# Patient Record
Sex: Female | Born: 2000 | Race: White | Hispanic: No | Marital: Single | Smoking: Never smoker
Health system: Southern US, Community
[De-identification: ages and names within clinical notes are randomized; demographics above are authoritative.]

## PROBLEM LIST (undated history)

## (undated) DIAGNOSIS — J45909 Unspecified asthma, uncomplicated: Secondary | ICD-10-CM

## (undated) DIAGNOSIS — F419 Anxiety disorder, unspecified: Secondary | ICD-10-CM

## (undated) DIAGNOSIS — F32A Depression, unspecified: Secondary | ICD-10-CM

## (undated) HISTORY — DX: Unspecified asthma, uncomplicated: J45.909

## (undated) HISTORY — PX: OTHER SURGICAL HISTORY: SHX169

## (undated) HISTORY — DX: Anxiety disorder, unspecified: F41.9

## (undated) HISTORY — DX: Depression, unspecified: F32.A

---
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
## 2023-01-19 ENCOUNTER — Encounter: Payer: Self-pay | Admitting: *Deleted

## 2023-06-13 ENCOUNTER — Encounter: Payer: Self-pay | Admitting: Nurse Practitioner

## 2023-06-26 DIAGNOSIS — F4323 Adjustment disorder with mixed anxiety and depressed mood: Secondary | ICD-10-CM | POA: Diagnosis not present

## 2023-07-05 DIAGNOSIS — F418 Other specified anxiety disorders: Secondary | ICD-10-CM | POA: Diagnosis not present

## 2023-07-11 DIAGNOSIS — F4323 Adjustment disorder with mixed anxiety and depressed mood: Secondary | ICD-10-CM | POA: Diagnosis not present

## 2023-07-19 DIAGNOSIS — F418 Other specified anxiety disorders: Secondary | ICD-10-CM | POA: Diagnosis not present

## 2023-07-25 DIAGNOSIS — Z5181 Encounter for therapeutic drug level monitoring: Secondary | ICD-10-CM | POA: Diagnosis not present

## 2023-08-15 ENCOUNTER — Ambulatory Visit: Payer: 59 | Admitting: Nurse Practitioner

## 2023-08-15 ENCOUNTER — Encounter: Payer: Self-pay | Admitting: Nurse Practitioner

## 2023-08-15 ENCOUNTER — Other Ambulatory Visit (INDEPENDENT_AMBULATORY_CARE_PROVIDER_SITE_OTHER): Payer: 59

## 2023-08-15 VITALS — BP 100/62 | HR 71 | Ht 62.0 in | Wt 112.0 lb

## 2023-08-15 DIAGNOSIS — R1084 Generalized abdominal pain: Secondary | ICD-10-CM

## 2023-08-15 DIAGNOSIS — R112 Nausea with vomiting, unspecified: Secondary | ICD-10-CM | POA: Diagnosis not present

## 2023-08-15 DIAGNOSIS — K5900 Constipation, unspecified: Secondary | ICD-10-CM

## 2023-08-15 LAB — CBC WITH DIFFERENTIAL/PLATELET
Basophils Absolute: 0 10*3/uL (ref 0.0–0.1)
Basophils Relative: 1 % (ref 0.0–3.0)
Eosinophils Absolute: 0.1 10*3/uL (ref 0.0–0.7)
Eosinophils Relative: 2.6 % (ref 0.0–5.0)
HCT: 42.1 % (ref 36.0–49.0)
Hemoglobin: 14 g/dL (ref 12.0–16.0)
Lymphocytes Relative: 33.2 % (ref 24.0–48.0)
Lymphs Abs: 1.5 10*3/uL (ref 0.7–4.0)
MCHC: 33.2 g/dL (ref 31.0–37.0)
MCV: 90.7 fL (ref 78.0–98.0)
Monocytes Absolute: 0.5 10*3/uL (ref 0.1–1.0)
Monocytes Relative: 10.9 % (ref 3.0–12.0)
Neutro Abs: 2.4 10*3/uL (ref 1.4–7.7)
Neutrophils Relative %: 52.3 % (ref 43.0–71.0)
Platelets: 283 10*3/uL (ref 150.0–575.0)
RBC: 4.64 Mil/uL (ref 3.80–5.70)
RDW: 12.8 % (ref 11.4–15.5)
WBC: 4.6 10*3/uL (ref 4.5–13.5)

## 2023-08-15 LAB — TSH: TSH: 0.84 u[IU]/mL (ref 0.40–5.00)

## 2023-08-15 NOTE — Progress Notes (Signed)
08/15/2023 Kristi Diaz [id number] [birth year]   CHIEF COMPLAINT: Abdominal pain  HISTORY OF PRESENT ILLNESS: Kristi Diaz is a 22 year old female with a past medical history of anxiety, depression, PTSD and asthma. She presents to our office today as referred by Dr. Neoma Laming for further evaluation regarding N/V and upper abdominal pain. She is accompanied by her mother. She has a decreased appetite for the past 4 to 5 years which she attributed to side effects from anxiety and antidepressant medications. She stated always being a picky eater. She describes having an eating disorder, sometimes doesn't eat and sometimes induces vomiting. She also has intermittent nausea and vomits spontaneously. Last vomited one month ago. She often does not have the urge to eat, when she gets the urge to eat, she eats quickly before she thinks too much about it. Certain smells of food makes her feel sick. She feels overwhelmed and sick to her stomach when she goes to the grocery store. She had daily heartburn with intermittent dysphagia, globus sensation for the entire month of August which mostly abated. Since then, she has heartburn only if she eats tomato products or foods with a lot of seasonings. She had severe pain throughout her right upper and lower abdomen extending like a bean shape formation to this area on 06/03/2023 therefore she presented to [facility] ED for further evaluation.  She was concerned she might have gallbladder issues. At that time, she had nausea and vomiting followed by upper abdominal wall spasms.  She described emesis as clear to bilious and a few times she saw a few spots of blood in the emesis.  CTAP was negative for acute intra-abdominal/pelvic pathology.  WBC 15.6.  Hemoglobin 14.1.  Platelet 417.  Sodium 138.  Potassium 3.8.  Glucose 129.  BUN 13.  Creatinine 0.72.  Total bili 0.8.  Alk phos 42.  AST 12.  ALT 4.  Lipase 14.  Urine hCG negative.  Urinalysis showed  positive for leukocytes and she was treated with Keflex p.o. for suspected UTI and was discharged home.  She stated her baseline weight was 125 pounds, mom stated she did not think daughter's normal weight was typically that high.  Weight in office today 112 pounds.  He eats a brownie or drinks a protein shake in the morning.  She typically eats chicken nuggets for lunch at [employer] and [employer] or salad for dinner.  She eats strawberries, no other fruits.  States she is to have a bowel movement time she ate but for the past month she has experienced constipation.  She is passing pellet-like stool most days.  She started taking MiraLAX 1 week ago.  No bloody or black stools.  Overall, over the past month she stated her GI symptoms have significantly improved.  She is seeing a therapist weekly and is scheduled to see a new psychiatrist in the upcoming weeks. Remains on [facility].  CTAP with contrast 06/03/2023 [facility] ED.  FINDINGS:  Lower chest: Unremarkable.   Hepatobiliary: No suspicious focal abnormality within the liver  parenchyma. There is no evidence for gallstones, gallbladder wall  thickening, or pericholecystic fluid. No intrahepatic or  extrahepatic biliary dilation.   Pancreas: No focal mass lesion. No dilatation of the main duct. No  intraparenchymal cyst. No peripancreatic edema.   Spleen: No splenomegaly. No suspicious focal mass lesion.   Adrenals/Urinary Tract: No adrenal nodule or mass. Kidneys  unremarkable. No evidence for hydroureter. The urinary bladder  appears  normal for the degree of distention.   Stomach/Bowel: Stomach is unremarkable. No gastric wall thickening.  No evidence of outlet obstruction. Duodenum is normally positioned  as is the ligament of Treitz. No small bowel wall thickening. No  small bowel dilatation. The terminal ileum is normal. The appendix  is normal. No gross colonic mass. No colonic wall thickening.   Vascular/Lymphatic: No abdominal  aortic aneurysm. No abdominal  aortic atherosclerotic calcification. There is no gastrohepatic or  hepatoduodenal ligament lymphadenopathy. No retroperitoneal or  mesenteric lymphadenopathy. No pelvic sidewall lymphadenopathy.   Reproductive: Unremarkable.   Other: Trace free fluid seen in the cul-de-sac.   Musculoskeletal: No worrisome lytic or sclerotic osseous  abnormality.   Social History: She occasionally drinks alcohol, Friday drank, prior to Friday didn't have any alcohol for a long time. Friday 3 shots rumchada.  She smokes marijuana daily x 2 years   Family History: No known family history of celiac disease or IBD.  No known family history of esophageal, gastric or colon cancer.  No Known Allergies   Outpatient Encounter Medications as of 08/15/2023  Medication Sig   cetirizine (ZYRTEC) 10 MG tablet Take 10 mg by mouth daily.   Pediatric Multiple Vit-C-FA (CHILDRENS CHEWABLE VITAMINS PO) Take 1 tablet by mouth daily.   No facility-administered encounter medications on file as of 08/15/2023.   REVIEW OF SYSTEMS:  Gen: + Fatigue. See HPI.  CV: Denies chest pain, palpitations or edema. Resp: Denies cough, shortness of breath of hemoptysis.  GI: See HPI.  GU: Denies urinary burning, blood in urine, increased urinary frequency or incontinence. Gyn: + Menstrual pain.  MS: + Back pain. Derm: Denies rash, itchiness, skin lesions or unhealing ulcers. Psych:+ Anxiety, depression. Heme: Denies bruising, easy bleeding. Neuro: + Headaches.  Endo:  Denies any problems with DM, thyroid or adrenal function.  PHYSICAL EXAM: BP 100/62   Pulse 71   Ht 5\' 2"  (1.575 m)   Wt 112 lb (50.8 kg)   BMI 20.49 kg/m   General: 22 year old female in no acute distress. Head: Normocephalic and atraumatic. Eyes:  Sclerae non-icteric, conjunctive pink. Ears: Normal auditory acuity. Mouth: Dentition intact. No ulcers or lesions.  Neck: Supple, no lymphadenopathy or thyromegaly.  Lungs:  Clear bilaterally to auscultation without wheezes, crackles or rhonchi. Heart: Regular rate and rhythm. No murmur, rub or gallop appreciated.  Abdomen: Soft, nondistended.  Mild generalized tenderness without rebound or guarding.  No masses. No hepatosplenomegaly. Normoactive bowel sounds x 4 quadrants.  Umbilical  ring intact. Rectal: Deferred.  Musculoskeletal: Symmetrical with no gross deformities. Skin: Warm and dry. No rash or lesions on visible extremities. Extremities: No edema. Neurological: Alert oriented x 4, no focal deficits.  Psychological:  Alert and cooperative. Normal mood and affect.  ASSESSMENT AND PLAN:  22 year old female with history of an eating disorder, decreased oral intake + intermittent self induced vomiting with recurrent nausea with episodes of spontaneous vomiting, heartburn, dysphagia, globus sensation and right sided abdominal pain. All of her GI symptoms have significantly improved over the past month. Normal CTAP. Low/normal LFTs and lipase level.  -Famotidine 20 mg p.o. daily as needed -Patient instructed to stop marijuana use as marijuana use likely contributing to N/V -Check Diatherix H. Pylori stool antigen -Recommended eating 3-4 small snack size meals daily -I discussed scheduling RUQ sonogram to evaluate the gallbladder if N/V and RUQ pain persists/recurs.  -I discussed scheduling an EGD if symptoms persist  -Follow up in 6 weeks  -Patient prefers female  GI, Dr. Leonides Schanz agreed to be patient's GI   Constipation -tTG, IgA and TSH -MiraLAX nightly as tolerated  Anxiety, depression and PTSD -Continue follow up with therapist and proceed with consult with new psychiatrist     CC:  Ronney Asters, MD

## 2023-08-15 NOTE — Patient Instructions (Addendum)
Your provider has requested that you go to the basement level for lab work before leaving today. Press "B" on the elevator. The lab is located at the first door on the left as you exit the elevator.   Your provider has ordered "Diatherix" stool testing for you. You have received a kit from our office today containing all necessary supplies to complete this test. Please carefully read the stool collection instructions provided in the kit before opening the accompanying materials. In addition, be sure to place the label from the top right corner of the laboratory request sheet onto the "puritan opti-swab" tube that is supplied in the kit. This label should include your full name and date of birth. After completing the test, you should secure the purtian tube into the specimen biohazard bag. The laboratory request information sheet (including date and time of specimen collection) should be placed into the outside pocket of the specimen biohazard bag and returned to the [facility] lab with 2 days of collection.   If the laboratory information sheet specimen date and time are not filled out, the test will NOT be performed.  Pepcid 20 mg (over the counter)- take 1 by mouth daily as needed  Miralax- every night as tolerated  Benefiber-take 1 tablespoon daily  Recommend eating 3-4 small snack sized meals daily.  Stop marijuana use.  Due to recent changes in healthcare laws, you may see the results of your imaging and laboratory studies on MyChart before your provider has had a chance to review them.  We understand that in some cases there may be results that are confusing or concerning to you. Not all laboratory results come back in the same time frame and the provider may be waiting for multiple results in order to interpret others.  Please give [place] 48 hours in order for your provider to thoroughly review all the results before contacting the office for clarification of your results.   Thank you for trusting  me with your gastrointestinal care!   Alcide Evener, CRNP

## 2023-08-15 NOTE — Progress Notes (Signed)
I agree with the assessment and plan as outlined by Ms. Kennedy-Smith. 

## 2023-08-17 LAB — IGA: Immunoglobulin A: 75 mg/dL (ref 47–310)

## 2023-08-17 LAB — TISSUE TRANSGLUTAMINASE ABS,IGG,IGA
(tTG) Ab, IgA: <1 U/mL
(tTG) Ab, IgG: <1 U/mL

## 2023-11-08 NOTE — Progress Notes (Deleted)
 11/08/2023 Kristi Diaz [id number] 02/28/2001   Chief Complaint:  History of Present Illness:  Kristi Diaz is a 23 year old female with a past medical history of anxiety, depression, PTSD and asthma.  I initially saw Kristi Diaz in office on 08/15/2023 due to nausea, vomiting and upper abdominal pain. Diatherix H. Pylori stool antigen test was ordered but was not done. TSH 0.84. tTG IgA < 1, tTG IgG < 1 and IgA 75.      Latest Ref Rng & Units 08/15/2023    9:49 AM  CBC  WBC 4.5 - 13.5 K/uL 4.6   Hemoglobin 12.0 - 16.0 g/dL 85.9   Hematocrit 63.9 - 49.0 % 42.1   Platelets 150.0 - 575.0 K/uL 283.0         CTAP with contrast 06/03/2023 [facility].  FINDINGS:  Lower chest: Unremarkable.   Hepatobiliary: No suspicious focal abnormality within the liver  parenchyma. There is no evidence for gallstones, gallbladder wall  thickening, or pericholecystic fluid. No intrahepatic or  extrahepatic biliary dilation.   Pancreas: No focal mass lesion. No dilatation of the main duct. No  intraparenchymal cyst. No peripancreatic edema.   Spleen: No splenomegaly. No suspicious focal mass lesion.   Adrenals/Urinary Tract: No adrenal nodule or mass. Kidneys  unremarkable. No evidence for hydroureter. The urinary bladder  appears normal for the degree of distention.   Stomach/Bowel: Stomach is unremarkable. No gastric wall thickening.  No evidence of outlet obstruction. Duodenum is normally positioned  as is the ligament of Treitz. No small bowel wall thickening. No  small bowel dilatation. The terminal ileum is normal. The appendix  is normal. No gross colonic mass. No colonic wall thickening.   Vascular/Lymphatic: No abdominal aortic aneurysm. No abdominal  aortic atherosclerotic calcification. There is no gastrohepatic or  hepatoduodenal ligament lymphadenopathy. No retroperitoneal or  mesenteric lymphadenopathy. No pelvic sidewall lymphadenopathy.   Reproductive:  Unremarkable.   Other: Trace free fluid seen in the cul-de-sac.   Musculoskeletal: No worrisome lytic or sclerotic osseous  abnormality.      Current Medications, Allergies, Past Medical History, Past Surgical History, Family History and Social History were reviewed in [employer] record.   Review of Systems:   Constitutional: Negative for fever, sweats, chills or weight loss.  Respiratory: Negative for shortness of breath.   Cardiovascular: Negative for chest pain, palpitations and leg swelling.  Gastrointestinal: See HPI.  Musculoskeletal: Negative for back pain or muscle aches.  Neurological: Negative for dizziness, headaches or paresthesias.    Physical Exam: There were no vitals taken for this visit. General: in no acute distress. Head: Normocephalic and atraumatic. Eyes: No scleral icterus. Conjunctiva pink . Ears: Normal auditory acuity. Mouth: Dentition intact. No ulcers or lesions.  Lungs: Clear throughout to auscultation. Heart: Regular rate and rhythm, no murmur. Abdomen: Soft, nontender and nondistended. No masses or hepatomegaly. Normal bowel sounds x 4 quadrants.  Rectal: Deferred.  Musculoskeletal: Symmetrical with no gross deformities. Extremities: No edema. Neurological: Alert oriented x 4. No focal deficits.  Psychological: Alert and cooperative. Normal mood and affect  Assessment and Recommendations:   23 year old female with history of an eating disorder, decreased oral intake + intermittent self induced vomiting with recurrent nausea with episodes of spontaneous vomiting, heartburn, dysphagia, globus sensation and right sided abdominal pain. All of her GI symptoms have significantly improved over the past month. Normal CTAP. Low/normal LFTs and lipase level.     Constipation -MiraLAX nightly  as tolerated   Anxiety, depression and PTSD

## 2023-11-09 ENCOUNTER — Ambulatory Visit: Payer: 59 | Admitting: Nurse Practitioner

## 2023-11-24 ENCOUNTER — Ambulatory Visit (HOSPITAL_COMMUNITY)
Admission: EM | Admit: 2023-11-24 | Discharge: 2023-11-24 | Disposition: A | Discharge status: Home / Self Care | Payer: 59 | Attending: Emergency Medicine | Admitting: Emergency Medicine

## 2023-11-24 ENCOUNTER — Encounter (HOSPITAL_COMMUNITY): Payer: Self-pay

## 2023-11-24 DIAGNOSIS — J4521 Mild intermittent asthma with (acute) exacerbation: Secondary | ICD-10-CM

## 2023-11-24 DIAGNOSIS — J101 Influenza due to other identified influenza virus with other respiratory manifestations: Secondary | ICD-10-CM

## 2023-11-24 LAB — POC COVID19/FLU A&B COMBO
Covid Antigen, POC: NEGATIVE
Influenza A Antigen, POC: POSITIVE — AB
Influenza B Antigen, POC: NEGATIVE

## 2023-11-24 MED ORDER — FLUTICASONE PROPIONATE 50 MCG/ACT NA SUSP: 1.0000 | Freq: Every day | NASAL | 2 refills | Status: DC

## 2023-11-24 MED ORDER — PROMETHAZINE-DM 6.25-15 MG/5ML PO SYRP: 5.0000 mL | ORAL_SOLUTION | Freq: Four times a day (QID) | ORAL | 0 refills | Status: DC | PRN

## 2023-11-24 MED ORDER — CETIRIZINE HCL 10 MG PO TABS: 10.0000 mg | ORAL_TABLET | Freq: Every day | ORAL | 1 refills | Status: AC

## 2023-11-24 MED ORDER — ALBUTEROL SULFATE HFA 108 (90 BASE) MCG/ACT IN AERS: 2.0000 | INHALATION_SPRAY | Freq: Four times a day (QID) | RESPIRATORY_TRACT | 2 refills | Status: AC | PRN

## 2023-11-24 NOTE — ED Triage Notes (Signed)
Pt states cough,runny nose and body aches for the past 4 days. States she has been taking tylenol cold at home with relief.

## 2023-11-24 NOTE — ED Provider Notes (Signed)
[facility]    CSN: [id number] Arrival date & time: 11/24/23  1618    HISTORY   Chief Complaint  Patient presents with   Cough   HPI MARLISE Diaz is a pleasant, 23 y.o. female who presents to urgent care today. Patient complains of cough, runny nose and bodyaches for the past 4 days.  Patient states she took Tylenol cold and relief earlier today which significantly improved her symptoms.  Prior to that, she had not been taking anything.  Patient reports a history of asthma and allergies, currently taking Zyrtec, albuterol inhaler is expired.  Patient states she went to a funeral with her mother last week, many people there were sick, mother has similar symptoms.  The history is provided by the patient.   Past Medical History:  Diagnosis Date   Anxiety    Asthma    Depression    Patient Active Problem List   Diagnosis Date Noted   Nausea with vomiting 08/15/2023   Hip abductor tendinitis, right 12/19/2019   Past Surgical History:  Procedure Laterality Date   wisdom teeth removal     OB History   No obstetric history on file.    Home Medications    Prior to Admission medications   Medication Sig Start Date End Date Taking? Authorizing Provider  albuterol (VENTOLIN HFA) 108 (90 Base) MCG/ACT inhaler Inhale 2 puffs into the lungs as needed. 03/20/23   [provider]  ALPRAZolam Prudy Feeler) 0.25 MG tablet Take 1 tablet by mouth 3 (three) times daily as needed.    [provider]  brexpiprazole (REXULTI) 1 MG TABS tablet Take 1 mg by mouth daily. 07/26/23   [provider]  INCASSIA 0.35 MG tablet Take 1 tablet by mouth daily.    [provider]  NURTEC 75 MG TBDP Take 1 tablet by mouth as needed.    [provider]  promethazine (PHENERGAN) 12.5 MG tablet Take 12.5 mg by mouth as needed.    [provider]    Family History History reviewed. No pertinent family history. Social History Social History    Tobacco Use   Smoking status: Never   Smokeless tobacco: Never  Vaping Use   Vaping status: Never Used  Substance Use Topics   Alcohol use: Never   Drug use: Never   Allergies   Patient has no known allergies.  Review of Systems Review of Systems Pertinent findings revealed after performing a 14 point review of systems has been noted in the history of present illness.  Physical Exam Vital Signs BP 128/75 (BP Location: Left Arm)   Pulse 91   Temp 98.8 F (37.1 C) (Oral)   Resp 16   LMP 09/29/2023 (Approximate)   SpO2 98%   No data found.  Physical Exam Constitutional:      Appearance: She is ill-appearing.  HENT:     Head: Normocephalic and atraumatic.     Salivary Glands: Right salivary gland is not diffusely enlarged or tender. Left salivary gland is not diffusely enlarged or tender.     Right Ear: Tympanic membrane, ear canal and external ear normal.     Left Ear: Tympanic membrane, ear canal and external ear normal.     Nose: Congestion and rhinorrhea present. Rhinorrhea is clear.     Right Sinus: No maxillary sinus tenderness or frontal sinus tenderness.     Left Sinus: No maxillary sinus tenderness.     Mouth/Throat:     Mouth: Mucous membranes  are moist.     Pharynx: Pharyngeal swelling, posterior oropharyngeal erythema and uvula swelling present.     Tonsils: No tonsillar exudate. 0 on the right. 0 on the left.  Cardiovascular:     Rate and Rhythm: Normal rate and regular rhythm.     Pulses: Normal pulses.  Pulmonary:     Effort: Pulmonary effort is normal. No tachypnea, bradypnea, accessory muscle usage, prolonged expiration or respiratory distress.     Breath sounds: No stridor, decreased air movement or transmitted upper airway sounds. No decreased breath sounds, wheezing, rhonchi or rales.  Abdominal:     General: Abdomen is flat. Bowel sounds are normal.     Palpations: Abdomen is soft.  Musculoskeletal:        General: Normal range of motion.   Lymphadenopathy:     Cervical: Cervical adenopathy present.     Right cervical: Superficial cervical adenopathy and posterior cervical adenopathy present.     Left cervical: Superficial cervical adenopathy and posterior cervical adenopathy present.  Skin:    General: Skin is warm and dry.  Neurological:     General: No focal deficit present.     Mental Status: She is alert and oriented to person, place, and time.     Motor: Motor function is intact.     Coordination: Coordination is intact.     Gait: Gait is intact.     Deep Tendon Reflexes: Reflexes are normal and symmetric.  Psychiatric:        Attention and Perception: Attention and perception normal.        Mood and Affect: Mood and affect normal.        Speech: Speech normal.        Behavior: Behavior normal. Behavior is cooperative.        Thought Content: Thought content normal.     Visual Acuity Right Eye Distance:   Left Eye Distance:   Bilateral Distance:    Right Eye Near:   Left Eye Near:    Bilateral Near:     UC Couse / Diagnostics / Procedures:     Radiology No results found.  Procedures Procedures (including critical care time) EKG  Pending results:  Labs Reviewed  POC COVID19/FLU A&B COMBO - Abnormal; Notable for the following components:      Result Value   Influenza A Antigen, POC Positive (*)    All other components within normal limits    Medications Ordered in UC: Medications - No data to display  UC Diagnoses / Final Clinical Impressions(s)   I have reviewed the triage vital signs and the nursing notes.  Pertinent labs & imaging results that were available during my care of the patient were reviewed by me and considered in my medical decision making (see chart for details).    Final diagnoses:  Mild intermittent asthma with acute exacerbation in adult  Influenza A   Influenza test today is positive, due to duration of symptoms, patient would not benefit from Tamiflu.  Patient  provided with refills of allergy and asthma medications.  Okay to continue Tylenol Cold and flu.  Conservative care recommended.  Return precautions advised.  Please see discharge instructions below for details of plan of care as provided to patient. ED Prescriptions     Medication Sig Dispense Auth. Provider   albuterol (VENTOLIN HFA) 108 (90 Base) MCG/ACT inhaler Inhale 2 puffs into the lungs every 6 (six) hours as needed for wheezing or shortness of breath (Cough). 18 g Lequita Halt,  Afreen Siebels Scales, PA-C   cetirizine (ZYRTEC ALLERGY) 10 MG tablet Take 1 tablet (10 mg total) by mouth at bedtime. 90 tablet Theadora Rama Scales, PA-C   fluticasone (FLONASE) 50 MCG/ACT nasal spray Place 1 spray into both nostrils daily. Begin by using 2 sprays in each nare daily for 3 to 5 days, then decrease to 1 spray in each nare daily. 15.8 mL Theadora Rama Scales, PA-C   promethazine-dextromethorphan (PROMETHAZINE-DM) 6.25-15 MG/5ML syrup Take 5 mLs by mouth 4 (four) times daily as needed for cough. 118 mL Theadora Rama Scales, PA-C      PDMP not reviewed this encounter.  Pending results:  Labs Reviewed  POC COVID19/FLU A&B COMBO - Abnormal; Notable for the following components:      Result Value   Influenza A Antigen, POC Positive (*)    All other components within normal limits      Discharge Instructions      Your rapid influenza antigen test today was positive.  Due to the duration of your symptoms, you would no longer benefit from antiviral therapy for influenza.     Your COVID-19 test is negative.        Conservative care is recommended with rest, drinking plenty of clear fluids, eating only when hungry, taking supportive medications for your symptoms and avoiding being around other people.  Please remain at home until you are fever free for 24 hours without the use of antifever medications such as Tylenol and ibuprofen.   Please read below to learn more about the medications, dosages and  frequencies that I recommend to help alleviate your symptoms and to get you feeling better soon:   Zyrtec (cetirizine): This is an excellent second-generation antihistamine that helps to reduce respiratory inflammatory response to environmental allergens.  In some patients, this medication can cause daytime sleepiness so I recommend that you take 1 tablet daily at bedtime.     Flonase (fluticasone): This is a steroid nasal spray that used once daily, 1 spray in each nare.  This works best when used on a daily basis. This medication does not work well if it is only used when you think you need it.  After 3 to 5 days of use, you will notice significant reduction of the inflammation and mucus production that is currently being caused by exposure to allergens, whether seasonal or environmental.  The most common side effect of this medication is nosebleeds.  If you experience a nosebleed, please discontinue use for 1 week, then feel free to resume.  If you find that your insurance will not pay for this medication, please consider a different nasal steroids such as Nasonex (mometasone), or Nasacort (triamcinolone).   Advil, Motrin (ibuprofen): This is a good anti-inflammatory medication which addresses aches, pains and inflammation of the upper airways that causes sinus and nasal congestion as well as in the lower airways which makes your cough feel tight and sometimes burn.  I recommend that you take between 400 to 600 mg every 6-8 hours as needed.  Please do not take more than 2400 mg of ibuprofen in a 24-hour period and please do not take high doses of ibuprofen for more than 3 days in a row as this can lead to stomach ulcers.   Promethazine DM: Promethazine is both a nasal decongestant that dries up mucous membranes and an antinausea medication.  Promethazine often makes most patients feel fairly sleepy.  "DM" is dextromethorphan, a single symptom reliever which is a cough suppressant found in  many  over-the-counter cough medications and combination cold preparations.  Please take 5 mL before bedtime to minimize your cough which will help you sleep better.  I have sent a prescription for this medication to your pharmacy because it cannot be purchased over-the-counter.   If symptoms have not meaningfully improved in the next 5 to 7 days, please return for repeat evaluation or follow-up with your regular provider.  If symptoms have worsened in the next 3 to 5 days, please go to the emergency room for further evaluation.    Thank you for visiting urgent care today.  We appreciate the opportunity to participate in your care.       Disposition Upon Discharge:  Condition: stable for discharge home  Patient presented with an acute illness with associated systemic symptoms and significant discomfort requiring urgent management. In my opinion, this is a condition that a prudent lay person (someone who possesses an average knowledge of health and medicine) may potentially expect to result in complications if not addressed urgently such as respiratory distress, impairment of bodily function or dysfunction of bodily organs.   Routine symptom specific, illness specific and/or disease specific instructions were discussed with the patient and/or caregiver at length.   As such, the patient has been evaluated and assessed, work-up was performed and treatment was provided in alignment with urgent care protocols and evidence based medicine.  Patient/parent/caregiver has been advised that the patient may require follow up for further testing and treatment if the symptoms continue in spite of treatment, as clinically indicated and appropriate.  Patient/parent/caregiver has been advised to return to the [facility] or PCP if no better; to PCP or the Emergency Department if new signs and symptoms develop, or if the current signs or symptoms continue to change or worsen for further workup, evaluation and treatment as  clinically indicated and appropriate  The patient will follow up with their current PCP if and as advised. If the patient does not currently have a PCP we will assist them in obtaining one.   The patient may need specialty follow up if the symptoms continue, in spite of conservative treatment and management, for further workup, evaluation, consultation and treatment as clinically indicated and appropriate.  Patient/parent/caregiver verbalized understanding and agreement of plan as discussed.  All questions were addressed during visit.  Please see discharge instructions below for further details of plan.  This office note has been dictated using Teaching laboratory technician.  Unfortunately, this method of dictation can sometimes lead to typographical or grammatical errors.  I apologize for your inconvenience in advance if this occurs.  Please do not hesitate to reach out to me if clarification is needed.      Theadora Rama Scales, [place] 11/24/23 2051

## 2023-11-24 NOTE — Discharge Instructions (Addendum)
Your rapid influenza antigen test today was positive.  Due to the duration of your symptoms, you would no longer benefit from antiviral therapy for influenza.     Your COVID-19 test is negative.        Conservative care is recommended with rest, drinking plenty of clear fluids, eating only when hungry, taking supportive medications for your symptoms and avoiding being around other people.  Please remain at home until you are fever free for 24 hours without the use of antifever medications such as Tylenol and ibuprofen.   Please read below to learn more about the medications, dosages and frequencies that I recommend to help alleviate your symptoms and to get you feeling better soon:   Zyrtec (cetirizine): This is an excellent second-generation antihistamine that helps to reduce respiratory inflammatory response to environmental allergens.  In some patients, this medication can cause daytime sleepiness so I recommend that you take 1 tablet daily at bedtime.     Flonase (fluticasone): This is a steroid nasal spray that used once daily, 1 spray in each nare.  This works best when used on a daily basis. This medication does not work well if it is only used when you think you need it.  After 3 to 5 days of use, you will notice significant reduction of the inflammation and mucus production that is currently being caused by exposure to allergens, whether seasonal or environmental.  The most common side effect of this medication is nosebleeds.  If you experience a nosebleed, please discontinue use for 1 week, then feel free to resume.  If you find that your insurance will not pay for this medication, please consider a different nasal steroids such as Nasonex (mometasone), or Nasacort (triamcinolone).   Advil, Motrin (ibuprofen): This is a good anti-inflammatory medication which addresses aches, pains and inflammation of the upper airways that causes sinus and nasal congestion as well as in the lower airways which  makes your cough feel tight and sometimes burn.  I recommend that you take between 400 to 600 mg every 6-8 hours as needed.  Please do not take more than 2400 mg of ibuprofen in a 24-hour period and please do not take high doses of ibuprofen for more than 3 days in a row as this can lead to stomach ulcers.   Promethazine DM: Promethazine is both a nasal decongestant that dries up mucous membranes and an antinausea medication.  Promethazine often makes most patients feel fairly sleepy.  "DM" is dextromethorphan, a single symptom reliever which is a cough suppressant found in many over-the-counter cough medications and combination cold preparations.  Please take 5 mL before bedtime to minimize your cough which will help you sleep better.  I have sent a prescription for this medication to your pharmacy because it cannot be purchased over-the-counter.   If symptoms have not meaningfully improved in the next 5 to 7 days, please return for repeat evaluation or follow-up with your regular provider.  If symptoms have worsened in the next 3 to 5 days, please go to the emergency room for further evaluation.    Thank you for visiting urgent care today.  We appreciate the opportunity to participate in your care.

## 2024-01-02 ENCOUNTER — Encounter (HOSPITAL_COMMUNITY): Payer: Self-pay

## 2024-01-02 ENCOUNTER — Ambulatory Visit (HOSPITAL_COMMUNITY)
Admission: RE | Admit: 2024-01-02 | Discharge: 2024-01-02 | Disposition: A | Discharge status: Home / Self Care | Source: Ambulatory Visit | Attending: Family Medicine | Admitting: Family Medicine

## 2024-01-02 VITALS — BP 100/64 | HR 79 | Temp 98.9°F | Resp 16

## 2024-01-02 DIAGNOSIS — H9202 Otalgia, left ear: Secondary | ICD-10-CM | POA: Diagnosis not present

## 2024-01-02 DIAGNOSIS — J302 Other seasonal allergic rhinitis: Secondary | ICD-10-CM | POA: Diagnosis not present

## 2024-01-02 MED ORDER — AMOXICILLIN 875 MG PO TABS: 875.0000 mg | ORAL_TABLET | Freq: Two times a day (BID) | ORAL | 0 refills | Status: AC

## 2024-01-02 NOTE — ED Triage Notes (Signed)
 Bilateral ear pain X 3 days. She is taking allergy meds.

## 2024-01-03 NOTE — ED Provider Notes (Signed)
  [facility]   [id number] 01/02/24 Arrival Time: 1731  ASSESSMENT & PLAN:  1. Acute otalgia, left   2. Seasonal allergies    Meds ordered this encounter  Medications   amoxicillin (AMOXIL) 875 MG tablet    Sig: Take 1 tablet (875 mg total) by mouth 2 (two) times daily for 7 days.    Dispense:  14 tablet    Refill:  0  OTC symptom care as needed.     Follow-up Information     Ronney Asters, MD.   Specialty: Pediatrics Why: As needed. Contact information: [address] [place] [address] [phone]                 Reviewed expectations re: course of current medical issues. Questions answered. Outlined signs and symptoms indicating need for more acute intervention. Understanding verbalized. After Visit Summary given.   SUBJECTIVE: History from: Patient. Kristi Diaz is a 23 y.o. female. Bilateral ear pain X 3 days. She is taking allergy meds.  Denies: fever. Normal PO intake without n/v/d. H/O ear infections.  OBJECTIVE:  Vitals:   01/02/24 1758  BP: 100/64  Pulse: 79  Resp: 16  Temp: 98.9 F (37.2 C)  TempSrc: Oral  SpO2: 98%    General appearance: alert; no distress Eyes: PERRLA; EOMI; conjunctiva normal HENT: [place]; AT; without nasal congestion; LEFT TM with erythema/bulging Neck: supple  Lungs: speaks full sentences without difficulty; unlabored Psychological: alert and cooperative; normal mood and affect   Allergies  Allergen Reactions   Trazodone     Other Reaction(s): hallucinations   Sertraline Other (See Comments)    sertraline    Past Medical History:  Diagnosis Date   Anxiety    Asthma    Depression    Social History   Socioeconomic History   Marital status: Single    Spouse name: Not on file   Number of children: Not on file   Years of education: Not on file   Highest education level: Not on file  Occupational History   Not on file  Tobacco Use   Smoking status: Never   Smokeless tobacco:  Never  Vaping Use   Vaping status: Every Day  Substance and Sexual Activity   Alcohol use: Yes   Drug use: Yes    Types: Marijuana   Sexual activity: Yes    Birth control/protection: Pill  Other Topics Concern   Not on file  Social History Narrative   Not on file   Social Drivers of Health   Financial Resource Strain: Not on file  Food Insecurity: Not on file  Transportation Needs: Not on file  Physical Activity: Not on file  Stress: Not on file  Social Connections: Unknown (03/07/2022)   Received from [facility]   Social Network    Social Network: Not on file  Intimate Partner Violence: Unknown (03/07/2022)   Received from [facility]   HITS    Physically Hurt: Not on file    Insult or Talk Down To: Not on file    Threaten Physical Harm: Not on file    Scream or Curse: Not on file   History reviewed. No pertinent family history. Past Surgical History:  Procedure Laterality Date   wisdom teeth removal       Mardella Layman, MD 01/03/24 1010
